# Patient Record
Sex: Female | Born: 1975 | Race: White | Hispanic: No | Marital: Single | Smoking: Never smoker
Health system: Southern US, Community
[De-identification: ages and names within clinical notes are randomized; demographics above are authoritative.]

## PROBLEM LIST (undated history)

## (undated) DIAGNOSIS — G8929 Other chronic pain: Secondary | ICD-10-CM

## (undated) DIAGNOSIS — M549 Dorsalgia, unspecified: Secondary | ICD-10-CM

## (undated) HISTORY — PX: BREAST SURGERY: SHX581

---

## 2008-03-22 ENCOUNTER — Emergency Department (HOSPITAL_BASED_OUTPATIENT_CLINIC_OR_DEPARTMENT_OTHER): Admission: EM | Admit: 2008-03-22 | Discharge: 2008-03-22 | Payer: Self-pay | Admitting: Emergency Medicine
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
## 2011-02-21 LAB — POCT TOXICOLOGY PANEL

## 2011-02-21 LAB — DIFFERENTIAL
Basophils Absolute: 0.1
Basophils Relative: 1
Eosinophils Relative: 1
Lymphs Abs: 1.6
Monocytes Absolute: 0.5
Neutro Abs: 5.9
Neutrophils Relative %: 72

## 2011-02-21 LAB — COMPREHENSIVE METABOLIC PANEL
Albumin: 4.6
Alkaline Phosphatase: 60
CO2: 24
Calcium: 9.3
Potassium: 3.4 — ABNORMAL LOW
Total Protein: 7.3

## 2011-02-21 LAB — CBC
HCT: 38.2
MCV: 91.7
WBC: 8.2

## 2011-02-21 LAB — URINE MICROSCOPIC-ADD ON

## 2011-02-21 LAB — URINALYSIS, ROUTINE W REFLEX MICROSCOPIC
Protein, ur: NEGATIVE
Urobilinogen, UA: 0.2

## 2013-08-03 ENCOUNTER — Emergency Department (HOSPITAL_BASED_OUTPATIENT_CLINIC_OR_DEPARTMENT_OTHER)
Admission: EM | Admit: 2013-08-03 | Discharge: 2013-08-03 | Disposition: A | Discharge status: Home / Self Care | Payer: Medicaid Other | Attending: Emergency Medicine | Admitting: Emergency Medicine

## 2013-08-03 DIAGNOSIS — M545 Low back pain, unspecified: Secondary | ICD-10-CM | POA: Insufficient documentation

## 2013-08-03 DIAGNOSIS — M549 Dorsalgia, unspecified: Secondary | ICD-10-CM

## 2013-08-03 DIAGNOSIS — G8929 Other chronic pain: Secondary | ICD-10-CM | POA: Insufficient documentation

## 2013-08-03 MED ORDER — HYDROCODONE-ACETAMINOPHEN 7.5-300 MG PO TABS: 1.0000 | ORAL_TABLET | Freq: Four times a day (QID) | ORAL | Status: AC | PRN

## 2013-08-03 NOTE — Discharge Instructions (Signed)
Follow up with your doctor. Do not take the narcotic if you are driving as it will make you sleepy.
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
## 2013-08-07 NOTE — ED Provider Notes (Signed)
Medical screening examination/treatment/procedure(s) were performed by non-physician practitioner and as supervising physician I was immediately available for consultation/collaboration.    Edgar Reisz R Lani Mendiola, MD 08/07/13 0714 

## 2014-07-10 ENCOUNTER — Other Ambulatory Visit: Payer: Self-pay

## 2014-07-10 ENCOUNTER — Emergency Department (HOSPITAL_BASED_OUTPATIENT_CLINIC_OR_DEPARTMENT_OTHER)
Admission: EM | Admit: 2014-07-10 | Discharge: 2014-07-10 | Disposition: A | Discharge status: Home / Self Care | Payer: Medicaid Other | Attending: Emergency Medicine | Admitting: Emergency Medicine

## 2014-07-10 ENCOUNTER — Encounter (HOSPITAL_BASED_OUTPATIENT_CLINIC_OR_DEPARTMENT_OTHER): Payer: Self-pay | Admitting: *Deleted

## 2014-07-10 DIAGNOSIS — Z79899 Other long term (current) drug therapy: Secondary | ICD-10-CM | POA: Diagnosis not present

## 2014-07-10 DIAGNOSIS — I493 Ventricular premature depolarization: Secondary | ICD-10-CM | POA: Diagnosis not present

## 2014-07-10 DIAGNOSIS — G8929 Other chronic pain: Secondary | ICD-10-CM | POA: Insufficient documentation

## 2014-07-10 DIAGNOSIS — R002 Palpitations: Secondary | ICD-10-CM

## 2014-07-10 HISTORY — DX: Other chronic pain: G89.29

## 2014-07-10 HISTORY — DX: Dorsalgia, unspecified: M54.9

## 2014-07-10 LAB — CBC WITH DIFFERENTIAL/PLATELET
BASOS ABS: 0.1 10*3/uL (ref 0.0–0.1)
Basophils Relative: 1 % (ref 0–1)
EOS PCT: 2 % (ref 0–5)
Eosinophils Absolute: 0.2 10*3/uL (ref 0.0–0.7)
HEMATOCRIT: 36 % (ref 36.0–46.0)
Hemoglobin: 11.8 g/dL — ABNORMAL LOW (ref 12.0–15.0)
LYMPHS ABS: 2.3 10*3/uL (ref 0.7–4.0)
LYMPHS PCT: 29 % (ref 12–46)
MCH: 31.2 pg (ref 26.0–34.0)
MCHC: 32.8 g/dL (ref 30.0–36.0)
MCV: 95.2 fL (ref 78.0–100.0)
MONO ABS: 0.5 10*3/uL (ref 0.1–1.0)
Monocytes Relative: 6 % (ref 3–12)
Neutro Abs: 4.9 10*3/uL (ref 1.7–7.7)
Neutrophils Relative %: 62 % (ref 43–77)
Platelets: 239 10*3/uL (ref 150–400)
RBC: 3.78 MIL/uL — ABNORMAL LOW (ref 3.87–5.11)
RDW: 11.2 % — AB (ref 11.5–15.5)
WBC: 7.8 10*3/uL (ref 4.0–10.5)

## 2014-07-10 LAB — BASIC METABOLIC PANEL
Anion gap: 3 — ABNORMAL LOW (ref 5–15)
BUN: 11 mg/dL (ref 6–23)
CHLORIDE: 105 mmol/L (ref 96–112)
CO2: 28 mmol/L (ref 19–32)
Calcium: 8.7 mg/dL (ref 8.4–10.5)
Creatinine, Ser: 0.64 mg/dL (ref 0.50–1.10)
GLUCOSE: 98 mg/dL (ref 70–99)
POTASSIUM: 3.7 mmol/L (ref 3.5–5.1)
Sodium: 136 mmol/L (ref 135–145)

## 2014-07-10 NOTE — Discharge Instructions (Signed)
Follow-up with the cardiology clinic. The contact information has been provided in this discharge summary. Call to arrange this appointment.  Return to the emergency department in the meantime if your symptoms substantially worsen or change.   Palpitations A palpitation is the feeling that your heartbeat is irregular or is faster than normal. It may feel like your heart is fluttering or skipping a beat. Palpitations are usually not a serious problem. However, in some cases, you may need further medical evaluation. CAUSES  Palpitations can be caused by:  Smoking.  Caffeine or other stimulants, such as diet pills or energy drinks.  Alcohol.  Stress and anxiety.  Strenuous physical activity.  Fatigue.  Certain medicines.  Heart disease, especially if you have a history of irregular heart rhythms (arrhythmias), such as atrial fibrillation, atrial flutter, or supraventricular tachycardia.  An improperly working pacemaker or defibrillator. DIAGNOSIS  To find the cause of your palpitations, your health care provider will take your medical history and perform a physical exam. Your health care provider may also have you take a test called an ambulatory electrocardiogram (ECG). An ECG records your heartbeat patterns over a 24-hour period. You may also have other tests, such as:  Transthoracic echocardiogram (TTE). During echocardiography, sound waves are used to evaluate how blood flows through your heart.  Transesophageal echocardiogram (TEE).  Cardiac monitoring. This allows your health care provider to monitor your heart rate and rhythm in real time.  Holter monitor. This is a portable device that records your heartbeat and can help diagnose heart arrhythmias. It allows your health care provider to track your heart activity for several days, if needed.  Stress tests by exercise or by giving medicine that makes the heart beat faster. TREATMENT  Treatment of palpitations depends on the  cause of your symptoms and can vary greatly. Most cases of palpitations do not require any treatment other than time, relaxation, and monitoring your symptoms. Other causes, such as atrial fibrillation, atrial flutter, or supraventricular tachycardia, usually require further treatment. HOME CARE INSTRUCTIONS   Avoid:  Caffeinated coffee, tea, soft drinks, diet pills, and energy drinks.  Chocolate.  Alcohol.  Stop smoking if you smoke.  Reduce your stress and anxiety. Things that can help you relax include:  A method of controlling things in your body, such as your heartbeats, with your mind (biofeedback).  Yoga.  Meditation.  Physical activity such as swimming, jogging, or walking.  Get plenty of rest and sleep. SEEK MEDICAL CARE IF:   You continue to have a fast or irregular heartbeat beyond 24 hours.  Your palpitations occur more often. SEEK IMMEDIATE MEDICAL CARE IF:  You have chest pain or shortness of breath.  You have a severe headache.  You feel dizzy or you faint. MAKE SURE YOU:  Understand these instructions.  Will watch your condition.  Will get help right away if you are not doing well or get worse. Document Released: 05/05/2000 Document Revised: 05/13/2013 Document Reviewed: 07/07/2011 [facility] Patient Information 2015 [place], [place]. This information is not intended to replace advice given to you by your health care provider. Make sure you discuss any questions you have with your health care provider.  Premature Ventricular Contraction Premature ventricular contraction (PVC) is an irregularity of the heart rhythm involving extra or skipped heartbeats. In some cases, they may occur without obvious cause or heart disease. Other times, they can be caused by an electrolyte change in the blood. These need to be corrected. They can also be seen when  there is not enough oxygen going to the heart. A common cause of this is plaque or cholesterol buildup. This  buildup decreases the blood supply to the heart. In addition, extra beats may be caused or aggravated by:  Excessive smoking.  Alcohol consumption.  Caffeine.  Certain medications  Some street drugs. SYMPTOMS   The sensation of feeling your heart skipping a beat (palpitations).  In many cases, the person may have no symptoms. SIGNS AND TESTS   A physical examination may show an occasional irregularity, but if the PVC beats do not happen often, they may not be found on physical exam.  Blood pressure is usually normal.  Other tests that may find extra beats of the heart are:  An EKG (electrocardiogram)  A Holter monitor which can monitor your heart over longer periods of time  An Angiogram (study of the heart arteries). TREATMENT  Usually extra heartbeats do not need treatment. The condition is treated only if symptoms are severe or if extra beats are very frequent or are causing problems. An underlying cause, if discovered, may also require treatment.  Treatment may also be needed if there may be a risk for other more serious cardiac arrhythmias.  PREVENTION   Moderation in caffeine, alcohol, and tobacco use may reduce the risk of ectopic heartbeats in some people.  Exercise often helps people who lead a sedentary (inactive) lifestyle. PROGNOSIS  PVC heartbeats are generally harmless and do not need treatment.  RISKS AND COMPLICATIONS   Ventricular tachycardia (occasionally).  There usually are no complications.  Other arrhythmias (occasionally). SEEK IMMEDIATE MEDICAL CARE IF:   You feel palpitations that are frequent or continual.  You develop chest pain or other problems such as shortness of breath, sweating, or nausea and vomiting.  You become light-headed or faint (pass out).  You get worse or do not improve with treatment. Document Released: 12/24/2003 Document Revised: 07/31/2011 Document Reviewed: 07/05/2007 [facility] Patient Information 2015 [place],  [place]. This information is not intended to replace advice given to you by your health care provider. Make sure you discuss any questions you have with your health care provider.

## 2014-07-10 NOTE — ED Provider Notes (Signed)
CSN: [id number]     Arrival date & time 07/10/14  1817 History  This chart was scribed for Geoffery Lyons, MD by Annye Asa, ED Scribe. This patient was seen in room MH02/MH02 and the patient's care was started at 6:47 PM.    Chief Complaint  Patient presents with  . Palpitations   Patient is a 39 y.o. female presenting with palpitations. The history is provided by the patient. No language interpreter was used.  Palpitations    HPI Comments: Alicia Fuller is an otherwise healthy 39 y.o. female who presents to the Emergency Department complaining of several months of intermittent palpitations, increasing in frequency over the past two days. She does report recent stress at home. She occasionally drinks caffeine, exercises without difficulty approximately 3x per week. She denies any other physical symptoms; no recent illness. She has discussed this issue in passing with her PCP but the increase in frequency has concerned her, prompting her to come to the ED tonight. She has never seen a cardiologist.    She takes Norco 10-325 as needed for chronic back pain.   Past Medical History  Diagnosis Date  . Chronic back pain    Past Surgical History  Procedure Laterality Date  . Breast surgery     No family history on file. History  Substance Use Topics  . Smoking status: Never Smoker   . Smokeless tobacco: Not on file  . Alcohol Use: Yes   OB History    No data available     Review of Systems  A complete 10 system review of systems was obtained and all systems are negative except as noted in the HPI and PMH.   Allergies  Review of patient's allergies indicates no known allergies.  Home Medications   Prior to Admission medications   Medication Sig Start Date End Date Taking? Authorizing Provider  HYDROcodone-acetaminophen (NORCO) 10-325 MG per tablet Take 1 tablet by mouth every 6 (six) hours as needed.   Yes Historical Provider, MD  Hydrocodone-Acetaminophen 7.5-300 MG TABS  Take 1 tablet by mouth every 6 (six) hours as needed. 08/03/13   Hope Orlene Och, NP   BP 129/75 mmHg  Pulse 84  Temp(Src) 98.6 F (37 C) (Oral)  Resp 16  Ht  (1.753 m)  Wt 142 lb (64.411 kg)  BMI 20.96 kg/m2  SpO2 100%  LMP 06/29/2014 Physical Exam  Constitutional: She is oriented to person, place, and time. She appears well-developed and well-nourished. No distress.  HENT:  Head: Normocephalic and atraumatic.  Mouth/Throat: Oropharynx is clear and moist. No oropharyngeal exudate.  Eyes: EOM are normal. Pupils are equal, round, and reactive to light.  Neck: Normal range of motion. Neck supple. No tracheal deviation present.  Cardiovascular: Normal rate, regular rhythm and normal heart sounds.  Exam reveals no gallop and no friction rub.   No murmur heard. Pulmonary/Chest: Effort normal. No respiratory distress. She has no wheezes. She has no rales.  Abdominal: Soft. There is no tenderness. There is no rebound and no guarding.  Musculoskeletal: Normal range of motion. She exhibits no edema.  Neurological: She is alert and oriented to person, place, and time.  Skin: Skin is warm and dry. No rash noted.  Psychiatric: She has a normal mood and affect. Her behavior is normal.  Nursing note and vitals reviewed.   ED Course  Procedures   DIAGNOSTIC STUDIES: Oxygen Saturation is 100% on RA, normal by my interpretation.    COORDINATION OF CARE: 6:51 PM  Discussed treatment plan with pt at bedside and pt agreed to plan.   Labs Review Labs Reviewed - No data to display  Imaging Review No results found.   Date: 07/10/2014  Rate: 93  Rhythm: normal sinus rhythm  QRS Axis: normal  Intervals: normal  ST/T Wave abnormalities: normal  Conduction Disutrbances:none  Narrative Interpretation:   Old EKG Reviewed: none available    MDM   Final diagnoses:  None    Patient presents with complaints of palpitations. She is experiencing an intermittent "skip". In her heart for  the past 2 days. This is not associated with any discomfort or difficulty breathing. Her EKG is normal and telemetry reveals occasional PVCs. She is able to feel these PVCs and states that this is the sensation that brings her here this evening. Her electrolytes are unremarkable. She does report increased stress recently which may be a precipitating factor. Either way, she appears very stable and in no distress. She is quite concerned about the onset of this and would like to follow-up with cardiology. I will provide her with the contact information for the cardiology office with him she can call to arrange an appointment. Her stance to return if her symptoms substantially worsen or change.   I personally performed the services described in this documentation, which was scribed in my presence. The recorded information has been reviewed and is accurate.      Geoffery Lyonsouglas Mickle Campton, MD 07/10/14 2006

## 2014-07-10 NOTE — ED Notes (Signed)
Pt has been having intermittent palpitations for several months. She sts they have become more frequent and are almost constant x2 days now. Pt reports some sob but denies dizziness.

## 2014-07-10 NOTE — ED Notes (Signed)
MD at bedside. 

## 2021-06-15 ENCOUNTER — Encounter: Payer: Self-pay | Admitting: Family Medicine
